# Patient Record
Sex: Male | Born: 1985 | Race: Black or African American | Hispanic: No | Marital: Single | Smoking: Former smoker
Health system: Southern US, Community
[De-identification: ages and names within clinical notes are randomized; demographics above are authoritative.]

---

## 2005-10-04 ENCOUNTER — Emergency Department (HOSPITAL_COMMUNITY): Admission: EM | Admit: 2005-10-04 | Discharge: 2005-10-04 | Payer: Self-pay | Admitting: Emergency Medicine

## 2006-10-29 IMAGING — CR DG CHEST 2V
2 series · 2 of 2 positions shown · non-contrast
Comparison: none

CLINICAL DATA: Flu symptoms, cold, cough, fever.
 CHEST - 2 VIEW:
 The heart size and mediastinal contours are within normal limits.  Both lungs are clear.  The visualized skeletal structures are unremarkable.

[w chest pa]
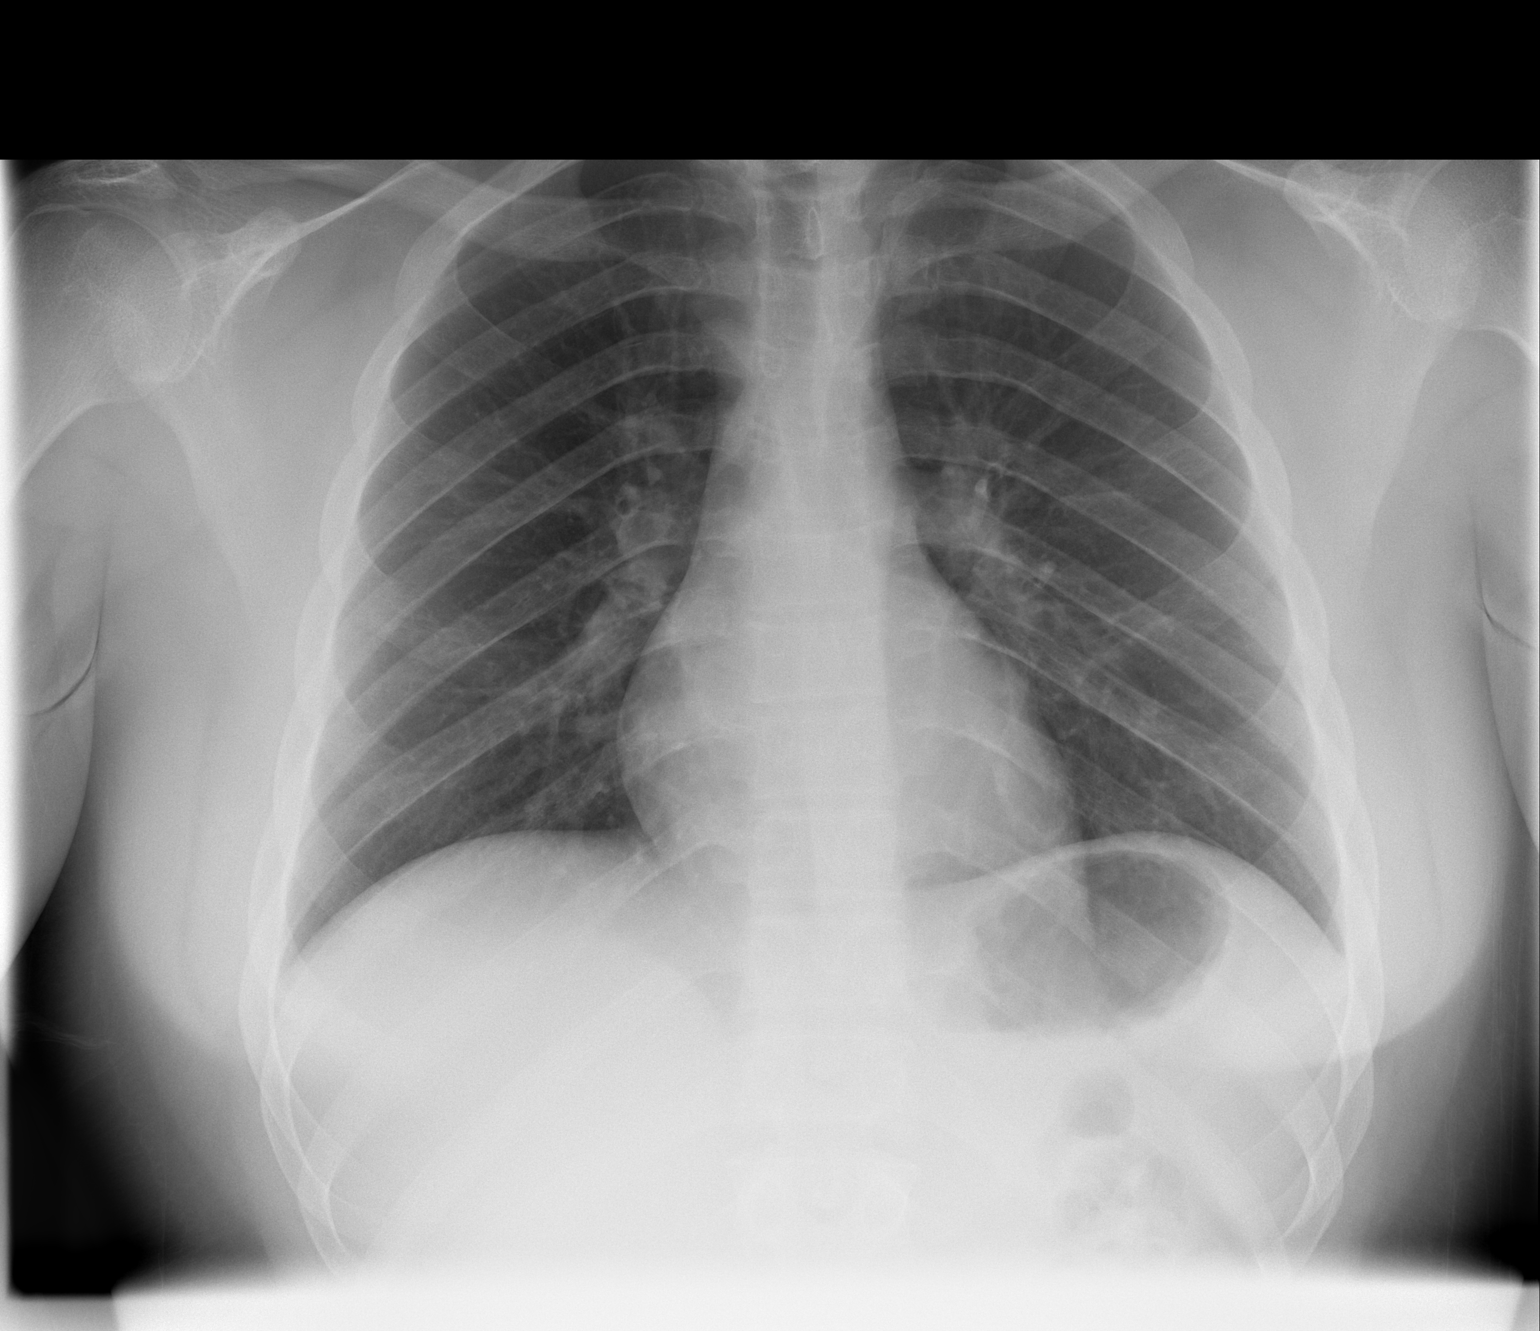

[w chest lat]
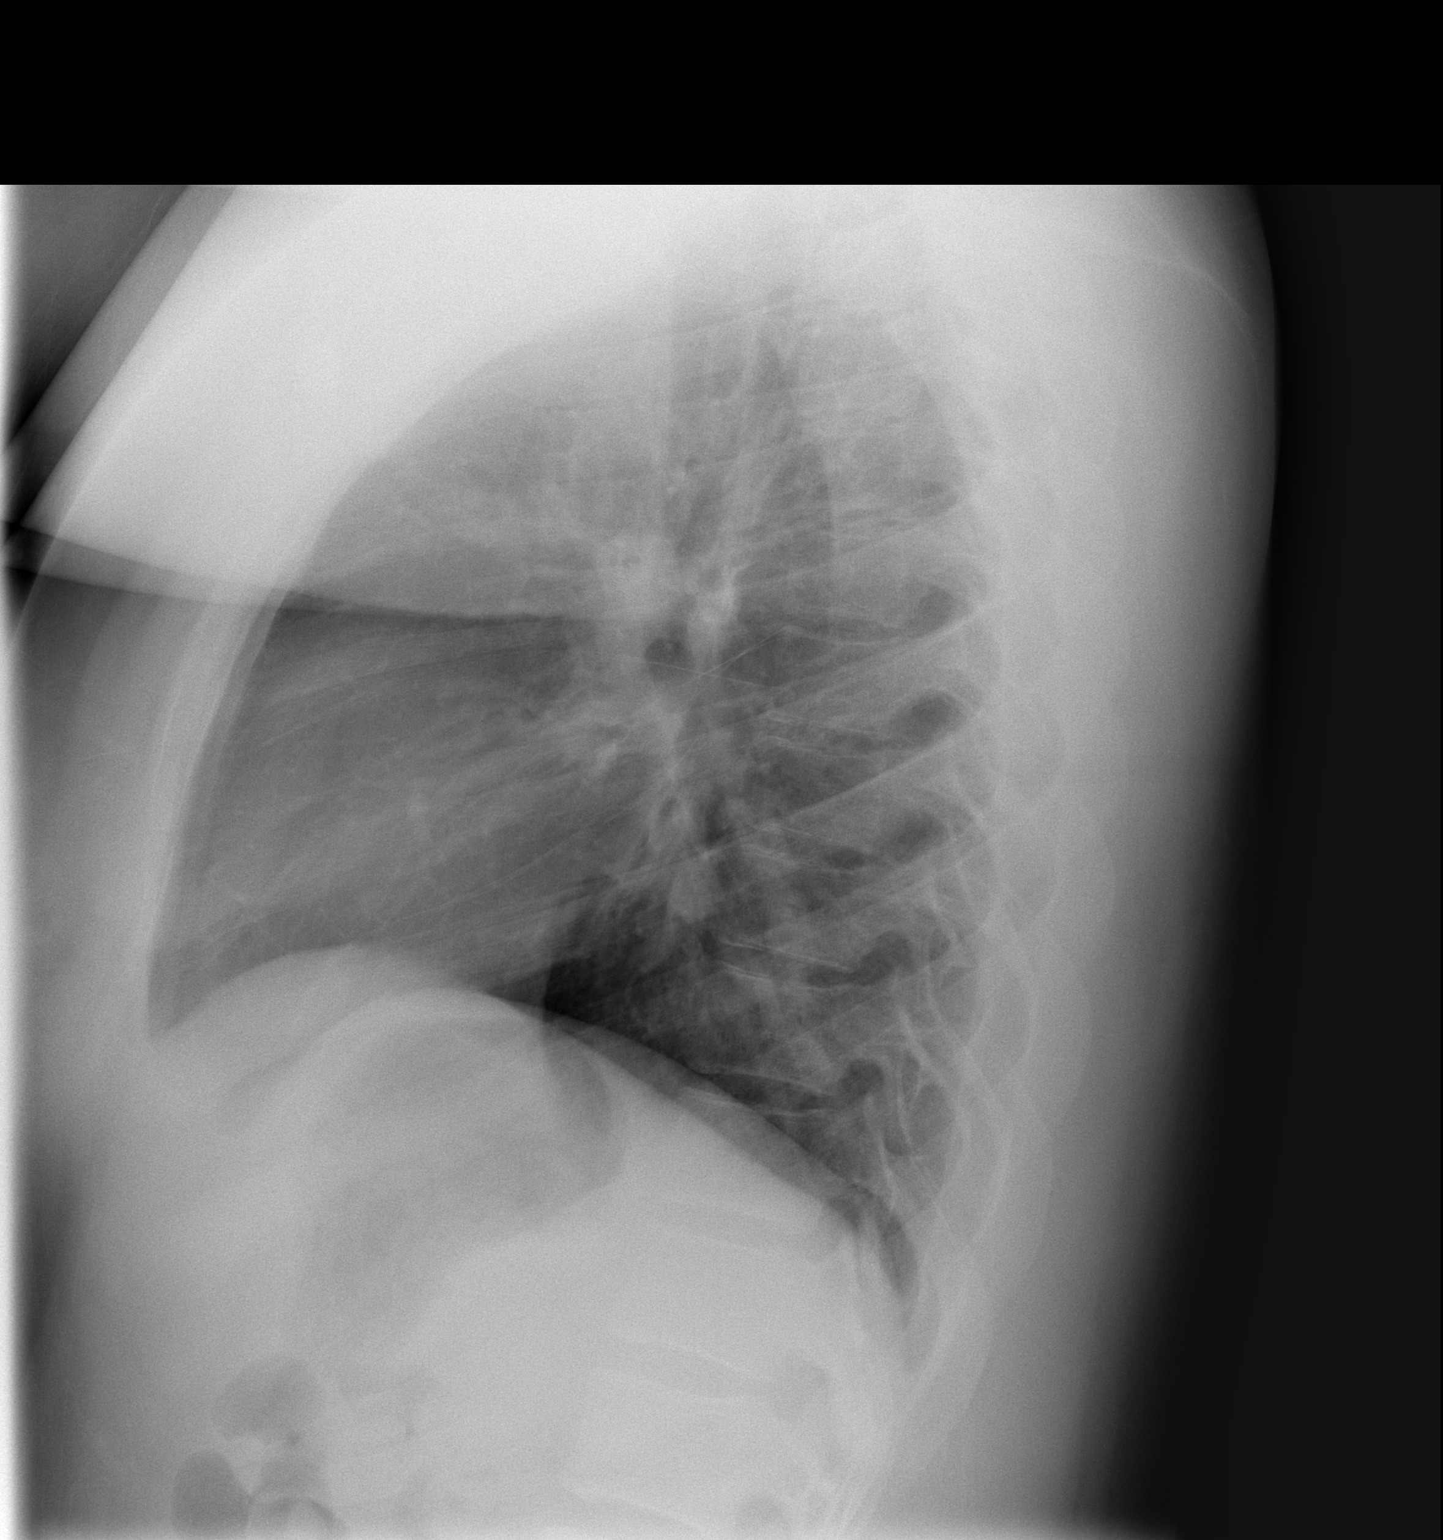

[2 of 2 positions shown; findings below may reference images not displayed]

IMPRESSION: No active cardiopulmonary disease.

## 2009-04-08 ENCOUNTER — Ambulatory Visit: Payer: Self-pay | Admitting: Family Medicine

## 2009-04-14 ENCOUNTER — Ambulatory Visit: Payer: Self-pay | Admitting: Family Medicine

## 2010-05-17 ENCOUNTER — Ambulatory Visit: Payer: Self-pay | Admitting: Family Medicine

## 2011-10-03 ENCOUNTER — Encounter: Payer: Self-pay | Admitting: Medical

## 2011-10-03 ENCOUNTER — Other Ambulatory Visit: Payer: Self-pay | Admitting: Medical

## 2011-10-03 ENCOUNTER — Ambulatory Visit (INDEPENDENT_AMBULATORY_CARE_PROVIDER_SITE_OTHER): Payer: 59 | Admitting: Medical

## 2011-10-03 VITALS — BP 130/90 | HR 76 | Temp 98.2°F | Resp 16 | Wt 248.0 lb

## 2011-10-03 DIAGNOSIS — L02411 Cutaneous abscess of right axilla: Secondary | ICD-10-CM

## 2011-10-03 DIAGNOSIS — IMO0002 Reserved for concepts with insufficient information to code with codable children: Secondary | ICD-10-CM

## 2011-10-03 DIAGNOSIS — L732 Hidradenitis suppurativa: Secondary | ICD-10-CM

## 2011-10-03 MED ORDER — DOXYCYCLINE HYCLATE 100 MG PO TABS: 100.0000 mg | ORAL_TABLET | Freq: Two times a day (BID) | ORAL | Status: AC

## 2011-10-03 MED ORDER — HYDROCODONE-ACETAMINOPHEN 5-500 MG PO TABS: 1.0000 | ORAL_TABLET | Freq: Four times a day (QID) | ORAL | Status: AC | PRN

## 2011-10-03 NOTE — Progress Notes (Signed)
Subjective:  Wyatt Gibbs is a 26 y.o. male who presents for evaluation of a probable cutaneous abscess. Lesion is located in the right axilla. Onset was 3 days ago. Symptoms have gradually worsened.  Abscess has associated symptoms of pain. 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 Patient does not have diabetes.    Objective:    There is an area characterized by a soft mobile subQ mass, erythema surrounding area measuring 4 cm, induration, fluctuance, tenderness, Location: right axilla.  Procedure Informed consent obtained.  The area was prepped in the usual manner and the skin overlying the abscess was anesthetized with 3cc of 1% lidocaine with epinephrine.  The area was sharply incised and approx 3 ccs of purulent material was obtained.  Area was irrigated with high pressure saline. Packing was inserted. Wound was covered with sterile bandage.      Assessment:   Encounter Diagnoses  Name Primary?  . Suppurative hidradenitis Yes  . Abscess of axilla, right      Plan:    Apply hot compresses frequently to promote drainage. Oral antibiotics -- see med orders. I & D procedure as above. RTC in 2 days or PRN.

## 2011-10-03 NOTE — Patient Instructions (Signed)
Begin Doxycycline twice daily for infection.  Use warm compresses to the area.   Use the Lortab every 4-6 hours as needed for pain.  Keep the area clean and dry.   Return on Friday for packing removal.   Abscess An abscess (boil or furuncle) is an infected area that contains a collection of pus.  SYMPTOMS Signs and symptoms of an abscess include pain, tenderness, redness, or hardness. You may feel a moveable soft area under your skin. An abscess can occur anywhere in the body.  TREATMENT  A surgical cut (incision) may be made over your abscess to drain the pus. Gauze may be packed into the space or a drain may be looped through the abscess cavity (pocket). This provides a drain that will allow the cavity to heal from the inside outwards. The abscess may be painful for a few days, but should feel much better if it was drained.  Your abscess, if seen early, may not have localized and may not have been drained. If not, another appointment may be required if it does not get better on its own or with medications. HOME CARE INSTRUCTIONS   Only take over-the-counter or prescription medicines for pain, discomfort, or fever as directed by your caregiver.   Take your antibiotics as directed if they were prescribed. Finish them even if you start to feel better.   Keep the skin and clothes clean around your abscess.   If the abscess was drained, you will need to use gauze dressing to collect any draining pus. Dressings will typically need to be changed 3 or more times a day.   The infection may spread by skin contact with others. Avoid skin contact as much as possible.   Practice good hygiene. This includes regular hand washing, cover any draining skin lesions, and do not share personal care items.   If you participate in sports, do not share athletic equipment, towels, whirlpools, or personal care items. Shower after every practice or tournament.   If a draining area cannot be adequately covered:    Do not participate in sports.   Children should not participate in day care until the wound has healed or drainage stops.   If your caregiver has given you a follow-up appointment, it is very important to keep that appointment. Not keeping the appointment could result in a much worse infection, chronic or permanent injury, pain, and disability. If there is any problem keeping the appointment, you must call back to this facility for assistance.  SEEK MEDICAL CARE IF:   You develop increased pain, swelling, redness, drainage, or bleeding in the wound site.   You develop signs of generalized infection including muscle aches, chills, fever, or a general ill feeling.   You have an oral temperature above 102 F (38.9 C).  MAKE SURE YOU:   Understand these instructions.   Will watch your condition.   Will get help right away if you are not doing well or get worse.  Document Released: 05/16/2005 Document Revised: 04/18/2011 Document Reviewed: 03/09/2008 [facility] Patient Information 2012 [place], [place].

## 2011-10-05 ENCOUNTER — Ambulatory Visit (INDEPENDENT_AMBULATORY_CARE_PROVIDER_SITE_OTHER): Payer: 59 | Admitting: Medical

## 2011-10-05 ENCOUNTER — Encounter: Payer: Self-pay | Admitting: Medical

## 2011-10-05 VITALS — BP 130/88 | HR 80 | Temp 98.4°F | Resp 16 | Wt 247.0 lb

## 2011-10-05 DIAGNOSIS — L02411 Cutaneous abscess of right axilla: Secondary | ICD-10-CM

## 2011-10-05 DIAGNOSIS — L732 Hidradenitis suppurativa: Secondary | ICD-10-CM

## 2011-10-05 DIAGNOSIS — IMO0002 Reserved for concepts with insufficient information to code with codable children: Secondary | ICD-10-CM

## 2011-10-05 NOTE — Progress Notes (Signed)
Subjective:   HPI  Wyatt Gibbs is a 26 y.o. male who presents with two-day recheck on right arm axillary abscess.  He has much improvement, no pain or swelling at this point. He is using antibiotic, is using pain medication with good improvement.   No other aggravating or relieving factors.    No other c/o.  The following portions of the patient's history were reviewed and updated as appropriate: allergies, current medications, past family history, past medical history, past social history, past surgical history and problem list.  No past medical history on file.  No Known Allergies   Review of Systems ROS reviewed and was negative other than noted in HPI or above.    Objective:   Physical Exam  General appearance: alert, no distress, WD/WN Skin: Right axilla with 3 cm area of induration, but no fluctuance, no erythema, much improved from 2 days ago.  Assessment and Plan :     Encounter Diagnoses  Name Primary?  . Suppurative hidradenitis Yes  . Abscess of axilla, right    Removed packing, and the right axillary abscess appears to be healing appropriately, advised he continue antibiotic, continue medication for pain as needed, continue warm compresses, discussed prevention, and call or return not resolving completely.

## 2011-10-07 LAB — WOUND CULTURE
Gram Stain: NONE SEEN
Gram Stain: NONE SEEN
Organism ID, Bacteria: NO GROWTH

## 2011-11-28 ENCOUNTER — Encounter: Payer: Self-pay | Admitting: Medical

## 2011-11-28 ENCOUNTER — Ambulatory Visit (INDEPENDENT_AMBULATORY_CARE_PROVIDER_SITE_OTHER): Payer: 59 | Admitting: Medical

## 2011-11-28 VITALS — BP 140/90 | HR 78 | Temp 98.2°F | Resp 16 | Ht 70.0 in | Wt 245.0 lb

## 2011-11-28 DIAGNOSIS — Z113 Encounter for screening for infections with a predominantly sexual mode of transmission: Secondary | ICD-10-CM

## 2011-11-28 DIAGNOSIS — E669 Obesity, unspecified: Secondary | ICD-10-CM

## 2011-11-28 DIAGNOSIS — Z0000 Encounter for general adult medical examination without abnormal findings: Secondary | ICD-10-CM

## 2011-11-28 DIAGNOSIS — I10 Essential (primary) hypertension: Secondary | ICD-10-CM

## 2011-11-28 DIAGNOSIS — F17200 Nicotine dependence, unspecified, uncomplicated: Secondary | ICD-10-CM

## 2011-11-28 LAB — CBC WITH DIFFERENTIAL/PLATELET
Lymphocytes Relative: 56 % — ABNORMAL HIGH (ref 12–46)
Lymphs Abs: 2.5 10*3/uL (ref 0.7–4.0)
MCV: 95.1 fL (ref 78.0–100.0)
Monocytes Absolute: 0.5 10*3/uL (ref 0.1–1.0)
Neutro Abs: 1.4 10*3/uL — ABNORMAL LOW (ref 1.7–7.7)
RDW: 11.7 % (ref 11.5–15.5)

## 2011-11-28 LAB — POCT URINALYSIS DIPSTICK
Bilirubin, UA: NEGATIVE
Blood, UA: NEGATIVE
Glucose, UA: NEGATIVE
Ketones, UA: NEGATIVE
Spec Grav, UA: 1.02

## 2011-11-28 LAB — LIPID PANEL
Cholesterol: 127 mg/dL (ref 0–200)
LDL Cholesterol: 73 mg/dL (ref 0–99)
Triglycerides: 60 mg/dL (ref <150)
VLDL: 12 mg/dL (ref 0–40)

## 2011-11-28 NOTE — Patient Instructions (Signed)
You have a new diagnosis of high blood pressure today.   However, I have faith that you can continue to work on diet and exercise to lose weight and get the pressure down without medication.  I will need to see you back in 6 months for recheck on blood pressure.  Preventative Care for Adults, Male       REGULAR HEALTH EXAMS:  A routine yearly physical is a good way to check in with your primary care provider about your health and preventive screening. It is also an opportunity to share updates about your health and any concerns you have, and receive a thorough all-over exam.   Most health insurance companies pay for at least some preventative services.  Check with your health plan for specific coverages.  WHAT PREVENTATIVE SERVICES DO MEN NEED?  Adult men should have their weight and blood pressure checked regularly.   Men age 54 and older should have their cholesterol levels checked regularly.  Beginning at age 67 and continuing to age 43, men should be screened for colorectal cancer.  Certain people should may need continued testing until age 66.  Other cancer screening may include exams for testicular and prostate cancer.  Updating vaccinations is part of preventative care.  Vaccinations help protect against diseases such as the flu.  Lab tests are generally done as part of preventative care to screen for anemia and blood disorders, to screen for problems with the kidneys and liver, to screen for bladder problems, to check blood sugar, and to check your cholesterol level.  Preventative services generally include counseling about diet, exercise, avoiding tobacco, drugs, excessive alcohol consumption, and sexually transmitted infections.    GENERAL RECOMMENDATIONS FOR GOOD HEALTH:  Healthy diet:  Eat a variety of foods, including fruit, vegetables, animal or vegetable protein, such as meat, fish, chicken, and eggs, or beans, lentils, tofu, and grains, such as rice.  Drink plenty of  water daily.  Decrease saturated fat in the diet, avoid lots of red meat, processed foods, sweets, fast foods, and fried foods.  Exercise:  Aerobic exercise helps maintain good heart health. At least 30-40 minutes of moderate-intensity exercise is recommended. For example, a brisk walk that increases your heart rate and breathing. This should be done on most days of the week.   Find a type of exercise or a variety of exercises that you enjoy so that it becomes a part of your daily life.  Examples are running, walking, swimming, water aerobics, and biking.  For motivation and support, explore group exercise such as aerobic class, spin class, Zumba, Yoga,or  martial arts, etc.    Set exercise goals for yourself, such as a certain weight goal, walk or run in a race such as a 5k walk/run.  Speak to your primary care provider about exercise goals.  Disease prevention:  If you smoke or chew tobacco, find out from your caregiver how to quit. It can literally save your life, no matter how long you have been a tobacco user. If you do not use tobacco, never begin.   Maintain a healthy diet and normal weight. Increased weight leads to problems with blood pressure and diabetes.   The Body Mass Index or BMI is a way of measuring how much of your body is fat. Having a BMI above 27 increases the risk of heart disease, diabetes, hypertension, stroke and other problems related to obesity. Your caregiver can help determine your BMI and based on it develop an exercise and  dietary program to help you achieve or maintain this important measurement at a healthful level.  High blood pressure causes heart and blood vessel problems.  Persistent high blood pressure should be treated with medicine if weight loss and exercise do not work.   Fat and cholesterol leaves deposits in your arteries that can block them. This causes heart disease and vessel disease elsewhere in your body.  If your cholesterol is found to be high,  or if you have heart disease or certain other medical conditions, then you may need to have your cholesterol monitored frequently and be treated with medication.   Ask if you should have a stress test if your history suggests this. A stress test is a test done on a treadmill that looks for heart disease. This test can find disease prior to there being a problem.  Avoid drinking alcohol in excess (more than two drinks per day).  Avoid use of street drugs. Do not share needles with anyone. Ask for professional help if you need assistance or instructions on stopping the use of alcohol, cigarettes, and/or drugs.  Brush your teeth twice a day with fluoride toothpaste, and floss once a day. Good oral hygiene prevents tooth decay and gum disease. The problems can be painful, unattractive, and can cause other health problems. Visit your dentist for a routine oral and dental check up and preventive care every 6-12 months.   Look at your skin regularly.  Use a mirror to look at your back. Notify your caregivers of changes in moles, especially if there are changes in shapes, colors, a size larger than a pencil eraser, an irregular border, or development of new moles.  Safety:  Use seatbelts 100% of the time, whether driving or as a passenger.  Use safety devices such as hearing protection if you work in environments with loud noise or significant background noise.  Use safety glasses when doing any work that could send debris in to the eyes.  Use a helmet if you ride a bike or motorcycle.  Use appropriate safety gear for contact sports.  Talk to your caregiver about gun safety.  Use sunscreen with a SPF (or skin protection factor) of 15 or greater.  Lighter skinned people are at a greater risk of skin cancer. Don't forget to also wear sunglasses in order to protect your eyes from too much damaging sunlight. Damaging sunlight can accelerate cataract formation.   Practice safe sex. Use condoms. Condoms are used  for birth control and to help reduce the spread of sexually transmitted infections (or STIs).  Some of the STIs are gonorrhea (the clap), chlamydia, syphilis, trichomonas, herpes, HPV (human papilloma virus) and HIV (human immunodeficiency virus) which causes AIDS. The herpes, HIV and HPV are viral illnesses that have no cure. These can result in disability, cancer and death.   Keep carbon monoxide and smoke detectors in your home functioning at all times. Change the batteries every 6 months or use a model that plugs into the wall.   Vaccinations:  Stay up to date with your tetanus shots and other required immunizations. You should have a booster for tetanus every 10 years. Be sure to get your flu shot every year, since 5%-20% of the [place] population comes down with the flu. The flu vaccine changes each year, so being vaccinated once is not enough. Get your shot in the fall, before the flu season peaks.   Other vaccines to consider:  Pneumococcal vaccine to protect against certain types of  pneumonia.  This is normally recommended for adults age 12 or older.  However, adults younger than 26 years old with certain underlying conditions such as diabetes, heart or lung disease should also receive the vaccine.  Shingles vaccine to protect against Varicella Zoster if you are older than age 73, or younger than 26 years old with certain underlying illness.  Hepatitis A vaccine to protect against a form of infection of the liver by a virus acquired from food.  Hepatitis B vaccine to protect against a form of infection of the liver by a virus acquired from blood or body fluids, particularly if you work in health care.  If you plan to travel internationally, check with your local health department for specific vaccination recommendations.  Cancer Screening:  Most routine colon cancer screening begins at the age of 60. On a yearly basis, doctors may provide special easy to use take-home tests to check for  hidden blood in the stool. Sigmoidoscopy or colonoscopy can detect the earliest forms of colon cancer and is life saving. These tests use a small camera at the end of a tube to directly examine the colon. Speak to your caregiver about this at age 86, when routine screening begins (and is repeated every 5 years unless early forms of pre-cancerous polyps or small growths are found).   At the age of 87 men usually start screening for prostate cancer every year. Screening may begin at a younger age for those with higher risk. Those at higher risk include African-Americans or having a family history of prostate cancer. There are two types of tests for prostate cancer:   Prostate-specific antigen (PSA) testing. Recent studies raise questions about prostate cancer using PSA and you should discuss this with your caregiver.   Digital rectal exam (in which your doctor's lubricated and gloved finger feels for enlargement of the prostate through the anus).   Screening for testicular cancer.  Do a monthly exam of your testicles. Gently roll each testicle between your thumb and fingers, feeling for any abnormal lumps. The best time to do this is after a hot shower or bath when the tissues are looser. Notify your caregivers of any lumps, tenderness or changes in size or shape immediately.

## 2011-11-28 NOTE — Progress Notes (Signed)
Subjective:   HPI  Wyatt Gibbs is a 26 y.o. male who presents for a complete physical.  No c/o, feeling healthy, exercising, eats "healthy".  No red meat or pork, no fast food, drinks mostly water.   Preventative care: Never seen eye doctor Last dental visit >1 year ago Last tetanus - possibly through college,  [employer] Declines flu shot  Reviewed their medical, surgical, family, social, medication, and allergy history and updated chart as appropriate.  History reviewed. No pertinent past medical history.  History reviewed. No pertinent past surgical history.  Family History  Problem Relation Age of Onset  . Diabetes Father   . Diabetes Paternal Grandfather   . Stroke Neg Hx   . Heart disease Neg Hx   . Hyperlipidemia Neg Hx   . Hypertension Neg Hx   . Cancer Neg Hx     History   Social History  . Marital Status: Single    Spouse Name: N/A    Number of Children: N/A  . Years of Education: N/A   Occupational History  . retail [employer]   Social History Main Topics  . Smoking status: Current Everyday Smoker -- 0.2 packs/day    Types: Cigars  . Smokeless tobacco: Not on file  . Alcohol Use: 3.6 oz/week    2 Glasses of wine, 2 Cans of beer, 2 Shots of liquor per week  . Drug Use: No  . Sexually Active: Not on file   Other Topics Concern  . Not on file   Social History Narrative   Lives with roommate, in relationship - girlfriend, exercise with walking free weights, running    No current outpatient prescriptions on file prior to visit.    No Known Allergies    Review of Systems Constitutional: -fever, -chills, -sweats, -unexpected weight change, -anorexia, -fatigue Allergy: -sneezing, -itching, -congestion Dermatology: denies changing moles, rash, lumps, new worrisome lesions ENT: -runny nose, -ear pain, -sore throat, -hoarseness, -sinus pain, -teeth pain, -tinnitus, -hearing loss, -epistaxis Cardiology:  -chest pain, -palpitations, -edema, -orthopnea,  -paroxysmal nocturnal dyspnea Respiratory: -cough, -shortness of breath, -dyspnea on exertion, -wheezing, -hemoptysis Gastroenterology: -abdominal pain, -nausea, -vomiting, -diarrhea, -constipation, -blood in stool, -changes in bowel movement, -dysphagia Hematology: -bleeding or bruising problems Musculoskeletal: -arthralgias, -myalgias, -joint swelling, -back pain, -neck pain, -cramping, -gait changes Ophthalmology: -vision changes, -eye redness, -itching, -discharge Urology: -dysuria, -difficulty urinating, -hematuria, -urinary frequency, -urgency, incontinence Neurology: -headache, -weakness, -tingling, -numbness, -speech abnormality, -memory loss, -falls, -dizziness Psychology:  -depressed mood, -agitation, -sleep problems     Objective:   Physical Exam  Filed Vitals:   11/28/11 0829  BP: 140/90  Pulse: 78  Temp: 98.2 F (36.8 C)  Resp: 16    General appearance: alert, no distress, WD/WN, obese black male Skin: scattered macules, no worrisome lesions HEENT: normocephalic, conjunctiva/corneas normal, sclerae anicteric, PERRLA, EOMi, nares patent, no discharge or erythema, pharynx normal Oral cavity: MMM, tongue normal, teeth in good repair Neck: supple, no lymphadenopathy, no thyromegaly, no masses, normal ROM, no bruits Chest: non tender, normal shape and expansion Heart: RRR, normal S1, S2, no murmurs Lungs: CTA bilaterally, no wheezes, rhonchi, or rales Abdomen: +bs, soft, non tender, non distended, no masses, no hepatomegaly, no splenomegaly, no bruits Back: non tender, normal ROM, no scoliosis Musculoskeletal: upper extremities non tender, no obvious deformity, normal ROM throughout, lower extremities non tender, no obvious deformity, normal ROM throughout Extremities: no edema, no cyanosis, no clubbing Pulses: 2+ symmetric, upper and lower extremities, normal cap refill Neurological: alert, oriented  x 3, CN2-12 intact, strength normal upper extremities and lower  extremities, sensation normal throughout, DTRs 2+ throughout, no cerebellar signs, gait normal Psychiatric: normal affect, behavior normal, pleasant  GU: normal male external genitalia, nontender, no masses, no hernia, no lymphadenopathy Rectal: deferred   Assessment and Plan :    Encounter Diagnoses  Name Primary?  . Routine general medical examination at a health care facility Yes  . Screen for STD (sexually transmitted disease)   . Essential hypertension, benign   . Obesity   . Tobacco use disorder     Physical exam - discussed healthy lifestyle, diet, exercise, preventative care, vaccinations, and addressed their concerns.    STD screening.  HTN - new diagnosis today.  He will work on diet and exercise changes to help lose weight and lower BP.  Obesity - same plan as HTN.  Tobacco use disorder - advised he stop Black and Mild completely.  Follow-up pending labs, 67mo on BP.

## 2011-11-29 LAB — COMPREHENSIVE METABOLIC PANEL
AST: 23 U/L (ref 0–37)
BUN: 18 mg/dL (ref 6–23)
CO2: 23 mEq/L (ref 19–32)
Total Bilirubin: 0.5 mg/dL (ref 0.3–1.2)
Total Protein: 6.7 g/dL (ref 6.0–8.3)

## 2011-11-29 LAB — TSH: TSH: 1.367 u[IU]/mL (ref 0.350–4.500)

## 2013-09-04 ENCOUNTER — Encounter: Payer: Self-pay | Admitting: Medical

## 2013-09-04 ENCOUNTER — Ambulatory Visit (INDEPENDENT_AMBULATORY_CARE_PROVIDER_SITE_OTHER): Payer: 59 | Admitting: Medical

## 2013-09-04 VITALS — BP 120/80 | HR 80 | Temp 97.7°F | Resp 16 | Wt 224.0 lb

## 2013-09-04 DIAGNOSIS — H0016 Chalazion left eye, unspecified eyelid: Secondary | ICD-10-CM

## 2013-09-04 DIAGNOSIS — H0019 Chalazion unspecified eye, unspecified eyelid: Secondary | ICD-10-CM

## 2013-09-04 DIAGNOSIS — L732 Hidradenitis suppurativa: Secondary | ICD-10-CM

## 2013-09-04 MED ORDER — AMOXICILLIN 875 MG PO TABS: 875.0000 mg | ORAL_TABLET | Freq: Two times a day (BID) | ORAL | Status: DC

## 2013-09-04 NOTE — Progress Notes (Signed)
Subjective: Over the last 2 weeks, felt like left eye swelled up.  Got worse after a few days.   Toward end of the week got better.   Works 3 jobs, so he thought eye swelling was because he wasn't getting enough sleep.   A coworker pointed out that he may have had a stye in his eye.   Denies eye drainage, no matting eye shut.  No blurry vision. No irritation now.   No vision changes, no drainage, no irritation now.  Using nothing for the symptoms. No history of getting debris in the eye.  Has recurrent knots under his arm, came in here 09/2011 for I&D of abscess in the arm.  However he says he continues to get these knots on a regular basis, they are tender sometimes they drain.    No past medical history on file.  ROS as in subjective  Objective: Gen: wd, wn, nad Eyes: Left upper eyelid with nontender 3 mm nodule at the eyelid border consistent with chalazion.  No erythema, no fluctuance, no drainage, no eyelid swelling otherwise, no redness of the eye, PERRLA, EOMI.  Right eye exam is normal. HEENT negative Left axilla with 1 cm tender nodule without fluctuance, warmth or drainage   Assessment: Encounter Diagnoses  Name Primary?  . Chalazion of left eye Yes  . Hidradenitis axillaris    Plan: Chalazion-discussed diagnosis, treatment, hygiene, possible complications, handout given.  For now use warm compresses, if not improving we can consider antibiotic eye drop or ophthalmology referral  Hidradenitis-discussed diagnosis, treatment options, he will begin amoxicillin for now.  If this is not improving the lesions, consider extended course of antibiotic versus general surgery referral

## 2013-09-04 NOTE — Patient Instructions (Signed)
Chalazion A chalazion is a swelling or hard lump on the eyelid caused by a blocked oil gland. Chalazions may occur on the upper or the lower eyelid.  CAUSES  Oil gland in the eyelid becomes blocked. SYMPTOMS   Swelling or hard lump on the eyelid. This lump may make it hard to see out of the eye.  The swelling may spread to areas around the eye. TREATMENT   Although some chalazions disappear by themselves in 1 or 2 months, some chalazions may need to be removed.  Medicines to treat an infection may be required. HOME CARE INSTRUCTIONS   Wash your hands often and dry them with a clean towel. Do not touch the chalazion.  Apply heat to the eyelid several times a day for 10 minutes to help ease discomfort and bring any yellowish white fluid (pus) to the surface. One way to apply heat to a chalazion is to use the handle of a metal spoon.  Hold the handle under hot water until it is hot, and then wrap the handle in paper towels so that the heat can come through without burning your skin.  Hold the wrapped handle against the chalazion and reheat the spoon handle as needed.  Apply heat in this fashion for 10 minutes, 4 times per day.  Return to your caregiver to have the pus removed if it does not break (rupture) on its own.  Do not try to remove the pus yourself by squeezing the chalazion or sticking it with a pin or needle.  Only take over-the-counter or prescription medicines for pain, discomfort, or fever as directed by your caregiver. SEEK IMMEDIATE MEDICAL CARE IF:   You have pain in your eye.  Your vision changes.  The chalazion does not go away.  The chalazion becomes painful, red, or swollen, grows larger, or does not start to disappear after 2 weeks. MAKE SURE YOU:   Understand these instructions.  Will watch your condition.  Will get help right away if you are not doing well or get worse. Document Released: 08/03/2000 Document Revised: 10/29/2011 Document Reviewed:  11/21/2009 [facility] Patient Information 2014 [place], [place].    Hidradenitis Suppurativa, Sweat Gland Abscess Hidradenitis suppurativa is a long lasting (chronic), uncommon disease of the sweat glands. With this, boil-like lumps and scarring develop in the groin, some times under the arms (axillae), and under the breasts. It may also uncommonly occur behind the ears, in the crease of the buttocks, and around the genitals.  CAUSES  The cause is from a blocking of the sweat glands. They then become infected. It may cause drainage and odor. It is not contagious. So it cannot be given to someone else. It most often shows up in puberty (about 52 to 28 years of age). But it may happen much later. It is similar to acne which is a disease of the sweat glands. This condition is slightly more common in African-Americans and women. SYMPTOMS   Hidradenitis usually starts as one or more red, tender, swellings in the groin or under the arms (axilla).  Over a period of hours to days the lesions get larger. They often open to the skin surface, draining clear to yellow-colored fluid.  The infected area heals with scarring. DIAGNOSIS  Your caregiver makes this diagnosis by looking at you. Sometimes cultures (growing germs on plates in the lab) may be taken. This is to see what germ (bacterium) is causing the infection.  TREATMENT   Topical germ killing medicine applied to  the skin (antibiotics) are the treatment of choice. Antibiotics taken by mouth (systemic) are sometimes needed when the condition is getting worse or is severe.  Avoid tight-fitting clothing which traps moisture in.  Dirt does not cause hidradenitis and it is not caused by poor hygiene.  Involved areas should be cleaned daily using an antibacterial soap. Some patients find that the liquid form of Lever 2000, applied to the involved areas as a lotion after bathing, can help reduce the odor related to this condition.  Sometimes surgery is  needed to drain infected areas or remove scarred tissue. Removal of large amounts of tissue is used only in severe cases.  Birth control pills may be helpful.  Oral retinoids (vitamin A derivatives) for 6 to 12 months which are effective for acne may also help this condition.  Weight loss will improve but not cure hidradenitis. It is made worse by being overweight. But the condition is not caused by being overweight.  This condition is more common in people who have had acne.  It may become worse under stress. There is no medical cure for hidradenitis. It can be controlled, but not cured. The condition usually continues for years with periods of getting worse and getting better (remission). Document Released: 03/20/2004 Document Revised: 10/29/2011 Document Reviewed: 04/05/2008 [facility] Patient Information 2014 [place], [place].

## 2013-11-18 ENCOUNTER — Ambulatory Visit (INDEPENDENT_AMBULATORY_CARE_PROVIDER_SITE_OTHER): Payer: 59 | Admitting: Family Medicine

## 2013-11-18 ENCOUNTER — Encounter: Payer: Self-pay | Admitting: Family Medicine

## 2013-11-18 VITALS — BP 120/78 | HR 68 | Temp 98.0°F | Ht 71.0 in | Wt 225.0 lb

## 2013-11-18 DIAGNOSIS — K5289 Other specified noninfective gastroenteritis and colitis: Secondary | ICD-10-CM

## 2013-11-18 DIAGNOSIS — K529 Noninfective gastroenteritis and colitis, unspecified: Secondary | ICD-10-CM

## 2013-11-18 NOTE — Patient Instructions (Signed)

## 2013-11-18 NOTE — Progress Notes (Signed)
Chief Complaint  Patient presents with  . Abdominal Pain    started Mon evening with chills and abdominal pain. Started vomiting late Monday night, early Tuesday am. Throughout the day on Tuesday started feeling better. Then again today started feeling stomach pains again-hasnt really been eating much and diarrhea has started today.    Monday evening (2 nights ago) he started with chills and centrally located abdominal pain.  Later that night he started vomiting.  He vomited again yesterday morning.  He wasn't able to go to the bathroom yesterday (only tried once), but today he had recurrent abdominal pain, and started having diarrhea.  He had 3-4 episodes of watery diarrhea.  Denies blood in stool.  Abdominal pain has resolved, but he continues to feel very fatigued today.  He slept all day yesterday.  Felt feverish this morning, but that resolved.  He was hot when he was vomiting, but didn't feel feverish throughout the day yesterday.  +sick contacts ([facility]).  He has been drinking a lot of water, some oranges yesterday, some chicken and vegetables last night (when feeling better yesterday), but appetite is decreased again today.  No recent antibiotic use.  No travel, camping, no undercooked, raw/spoiled foods.  History reviewed. No pertinent past medical history. History reviewed. No pertinent past surgical history. History   Social History  . Marital Status: Single    Spouse Name: N/A    Number of Children: N/A  . Years of Education: N/A   Occupational History  . retail [employer]   Social History Main Topics  . Smoking status: Former Smoker -- 0.25 packs/day    Types: Cigars  . Smokeless tobacco: Never Used  . Alcohol Use: 3.6 oz/week    2 Glasses of wine, 2 Cans of beer, 2 Shots of liquor per week     Comment: occasionally on weekends only.  . Drug Use: No  . Sexual Activity: Not on file   Other Topics Concern  . Not on file    Social History Narrative   Lives with roommate, in relationship - girlfriend, exercise with walking free weights, running.   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   No current facility-administered medications on file prior to visit.    No Known Allergies  ROS:  Headache yesterday, resolved.  No dizziness.  No chest pain, palpitations. No URI complaints or urinary complaints. No bleeding, bruising, rashes or other concerns except those per HPI.  PHYSICAL EXAM: BP 120/78  Pulse 68  Temp(Src) 98 F (36.7 C)  Ht 5\' 11"  (1.803 m)  Wt 225 lb (102.059 kg)  BMI 31.39 kg/m2 Well appearing male in no distress HEENT:  PERRL, EOMI, conjunctiva clear.  OP clear, mucus membranes moist Neck: no lymphadenopathy, thyromegaly or mass Heart: regular rate and rhythm, no murmur Lungs: clear bilaterally Abdomen: soft, nontender, no organomegaly or mass.  Normal bowel sounds Extremities: no edema Skin: no rash Psych: normal mood, affect Neuro: alert and oriented. Normal strength, gait, cranial nerves  ASSESSMENT/PLAN:  Acute gastroenteritis - viral, suspect norovirus  Supportive measures reviewed in detail. BRAT diet, advance as tolerated.  Avoid dairy. Imodium prn if diarrhea recurs/worsens  Return if increasing pain, fever, blood in stool, persistent/worsening symptoms

## 2014-07-26 ENCOUNTER — Encounter (HOSPITAL_COMMUNITY): Payer: Self-pay | Admitting: Emergency Medicine

## 2014-07-26 ENCOUNTER — Emergency Department (INDEPENDENT_AMBULATORY_CARE_PROVIDER_SITE_OTHER)
Admission: EM | Admit: 2014-07-26 | Discharge: 2014-07-26 | Disposition: A | Discharge status: Home / Self Care | Payer: Self-pay | Source: Home / Self Care | Attending: Emergency Medicine | Admitting: Emergency Medicine

## 2014-07-26 DIAGNOSIS — B86 Scabies: Secondary | ICD-10-CM

## 2014-07-26 MED ORDER — PERMETHRIN 5 % EX CREA: TOPICAL_CREAM | CUTANEOUS | Status: AC

## 2014-07-26 NOTE — ED Provider Notes (Signed)
  Chief Complaint    Rash   History of Present Illness      Honor JunesMarkus Kraai is a 28 year old male who is a one-week history of mildly itchy rash on his hands, wrists, and forearms. He has no rash elsewhere on his body. He has not been around anyone else's had a similar rash. He denies any systemic symptoms such as fever, chills, headache, or URI symptoms. There are no lesions inside the mouth or on the feet. He's not been in contact with any allergens or antigens. No new foods or medications.  Review of Systems   Other than as noted above, the patient denies any of the following symptoms: Systemic:  No fever or chills. ENT:  No nasal congestion, rhinorrhea, sore throat, swelling of lips, tongue or throat. Resp:  No cough, wheezing, or shortness of breath.  PMFSH    Past medical history, family history, social history, meds, and allergies were reviewed.  Physical Exam     Vital signs:  BP 138/72 mmHg  Pulse 64  Temp(Src) 97.8 F (36.6 C) (Oral)  Resp 16  SpO2 99% Gen:  Alert, oriented, in no distress. ENT:  Pharynx clear, no intraoral lesions, moist mucous membranes. Lungs:  Clear to auscultation. Skin:   He has numerous, small erythematous papules on the wrists, sides of the fingers, and interdigital webs. Skin was otherwise clear.       Assessment    The encounter diagnosis was Scabies.  Plan     1.  Meds:  The following meds were prescribed:   Discharge Medication List as of 07/26/2014 11:22 AM    START taking these medications   Details  permethrin (ELIMITE) 5 % cream Apply head to toe at bedtime. Leave on 8 hours.  Scrub off next morning.  Repeat in 1 week., Normal        2.  Patient Education/Counseling:  The patient was given appropriate handouts, self care instructions, and instructed in symptomatic relief.    3.  Follow up:  The patient was told to follow up here if no better in 3 to 4 days, or sooner if becoming worse in any way, and given some red flag  symptoms such as worsening rash, fever, or difficulty breathing which would prompt immediate return.  Follow up here if necessary.      Reuben Likesavid C Hetvi Shawhan, MD 07/26/14 [phone]

## 2014-07-26 NOTE — ED Notes (Signed)
Pt states that he has had a rash on his hands for a week

## 2014-07-26 NOTE — Discharge Instructions (Signed)
Scabies Scabies are small bugs (mites) that burrow under the skin and cause red bumps and severe itching. These bugs can only be seen with a microscope. Scabies are highly contagious. They can spread easily from person to person by direct contact. They are also spread through sharing clothing or linens that have the scabies mites living in them. It is not unusual for an entire family to become infected through shared towels, clothing, or bedding.  HOME CARE INSTRUCTIONS  1. Your caregiver may prescribe a cream or lotion to kill the mites. If cream is prescribed, massage the cream into the entire body from the neck to the bottom of both feet. Also massage the cream into the scalp and face if your child is less than 28 year old. Avoid the eyes and mouth. Do not wash your hands after application. 2. Leave the cream on for 8 to 12 hours. Your child should bathe or shower after the 8 to 12 hour application period. Sometimes it is helpful to apply the cream to your child right before bedtime. 3. One treatment is usually effective and will eliminate approximately 95% of infestations. For severe cases, your caregiver may decide to repeat the treatment in 1 week. Everyone in your household should be treated with one application of the cream. 4. New rashes or burrows should not appear within 24 to 48 hours after successful treatment. However, the itching and rash may last for 2 to 4 weeks after successful treatment. Your caregiver may prescribe a medicine to help with the itching or to help the rash go away more quickly. 5. Scabies can live on clothing or linens for up to 3 days. All of your child's recently used clothing, towels, stuffed toys, and bed linens should be washed in hot water and then dried in a dryer for at least 20 minutes on high heat. Items that cannot be washed should be enclosed in a plastic bag for at least 3 days. 6. To help relieve itching, bathe your child in a cool bath or apply cool washcloths  to the affected areas. 7. Your child may return to school after treatment with the prescribed cream. SEEK MEDICAL CARE IF:   The itching persists longer than 4 weeks after treatment.  The rash spreads or becomes infected. Signs of infection include red blisters or yellow-tan crust. Document Released: 08/06/2005 Document Revised: 10/29/2011 Document Reviewed: 12/15/2008 [facility] Patient Information 2015 [place]. This information is not intended to replace advice given to you by your health care provider. Make sure you discuss any questions you have with your health care provider.  Scabies is a rash caused by infestation with the mite Sarcoptes scabiei, a 0.3 mm mite that can burrow and deposit eggs in the the surface layer of the skin.  It is transmitted from other infected persons and often the entire family is infested even though they may not have symptoms.  The most common symptom is a very itchy rash.  Scabies can be treated by applying a cream with a medication call Permethrin.  An oral medication called Ivermectin is also available.  Here are the the instructions for treatment of scabies.  It is important to remember that all household members should be treated twice.  Once now, and once in 2 weeks.   On the first night, shower, then apply the Elimite cream from head to toe.  This means on the scalp, face, armpits, hands and wrists, under the breasts, the naval, the genital area, the cleft  between the buttocks, the feet and between the toes--everywhere on the body.  Do not miss even 1 square inch.  Leave the cream on overnight, at least 8 hours.  The next morning, shower again and scrub the Elimite off completely.  Clip the nails short and scrub under the nails with a toothbrush, since the mites can often live under the nails, then be transmitted to other parts of the body.   After that, strip the bed and wash sheets, pillow cases, pajamas, underwear and anything that has come in  contact with your body in the past month in hot water.    Spray your matresses, chairs, couch, and furniture with RID spray which can be gotten over the counter at the drug store.  Repeat this entire process in 1 week.  After the 2 applications, all the mites on your body should be dead.  It will take a while for the itching to go away, sometimes as much as a month.  The skin must rid itself of all the dead mites, eggs, and excreta.  You can use antihistamines such as Benadryl until this happens.  If the itching is severe, cortisone derivatives may help.  If the itching persists, it may be that the rash is caused by something else, that your were reinfected or the Permethrin did not work in which case it would be reasonable to try the oral pill Ivermectin.  If you still have a rash or itching in 1 month, return to the Urgent Care Center or your primary care doctor for a recheck.

## 2014-08-16 ENCOUNTER — Telehealth: Payer: Self-pay | Admitting: Family Medicine

## 2014-08-16 NOTE — Telephone Encounter (Signed)
ER letter sent 

## 2018-12-11 ENCOUNTER — Encounter: Payer: Self-pay | Admitting: Family Medicine
